# Patient Record
Sex: Female | Born: 1985 | Race: White | Hispanic: No | Marital: Single
Health system: Southern US, Community
[De-identification: ages and names within clinical notes are randomized; demographics above are authoritative.]

---
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
## 2004-04-17 ENCOUNTER — Emergency Department (HOSPITAL_COMMUNITY): Admission: EM | Admit: 2004-04-17 | Discharge: 2004-04-17 | Payer: Self-pay | Admitting: Emergency Medicine

## 2004-04-18 ENCOUNTER — Inpatient Hospital Stay (HOSPITAL_COMMUNITY): Admission: EM | Admit: 2004-04-18 | Discharge: 2004-04-22 | Payer: Self-pay | Admitting: Urology

## 2004-10-12 ENCOUNTER — Other Ambulatory Visit: Admission: RE | Admit: 2004-10-12 | Discharge: 2004-10-12 | Payer: Self-pay | Admitting: Obstetrics and Gynecology

## 2004-10-15 ENCOUNTER — Other Ambulatory Visit: Admission: RE | Admit: 2004-10-15 | Discharge: 2004-10-15 | Payer: Self-pay | Admitting: Obstetrics and Gynecology

## 2005-03-23 ENCOUNTER — Inpatient Hospital Stay (HOSPITAL_COMMUNITY): Admission: AD | Admit: 2005-03-23 | Discharge: 2005-03-25 | Payer: Self-pay | Admitting: Obstetrics & Gynecology

## 2005-09-03 ENCOUNTER — Emergency Department (HOSPITAL_COMMUNITY): Admission: EM | Admit: 2005-09-03 | Discharge: 2005-09-04 | Payer: Self-pay | Admitting: Emergency Medicine

## 2009-02-22 ENCOUNTER — Inpatient Hospital Stay (HOSPITAL_COMMUNITY): Admission: AD | Admit: 2009-02-22 | Discharge: 2009-02-25 | Payer: Self-pay | Admitting: Obstetrics & Gynecology

## 2010-03-20 ENCOUNTER — Inpatient Hospital Stay (HOSPITAL_COMMUNITY)
Admission: AD | Admit: 2010-03-20 | Discharge: 2010-03-20 | Payer: Self-pay | Source: Home / Self Care | Admitting: Obstetrics & Gynecology

## 2010-03-30 ENCOUNTER — Ambulatory Visit (HOSPITAL_COMMUNITY)
Admission: RE | Admit: 2010-03-30 | Discharge: 2010-03-30 | Payer: Self-pay | Source: Home / Self Care | Admitting: Obstetrics and Gynecology

## 2010-03-30 IMAGING — US US OB COMP +14 WK
1 series · 12 of 28 positions shown · non-contrast
Comparison: none

[Series 1: us ob comp +14 wk · 0.21mm/px · 12 of 94 slices shown]
[im 4/94]
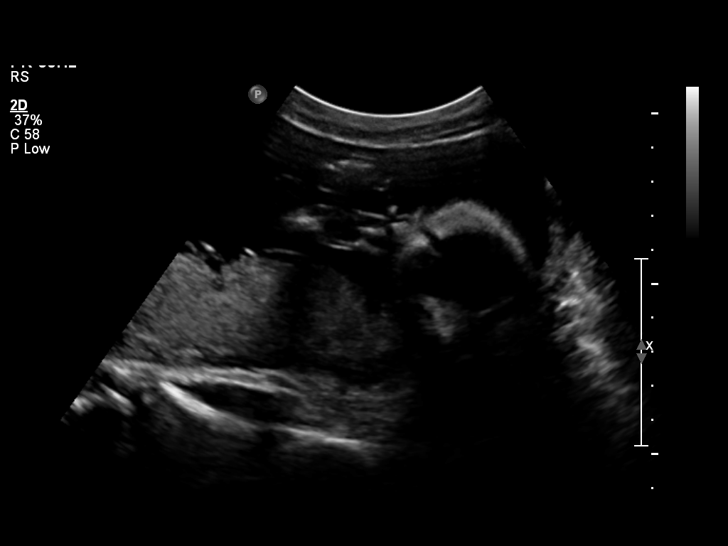
[im 11/94]
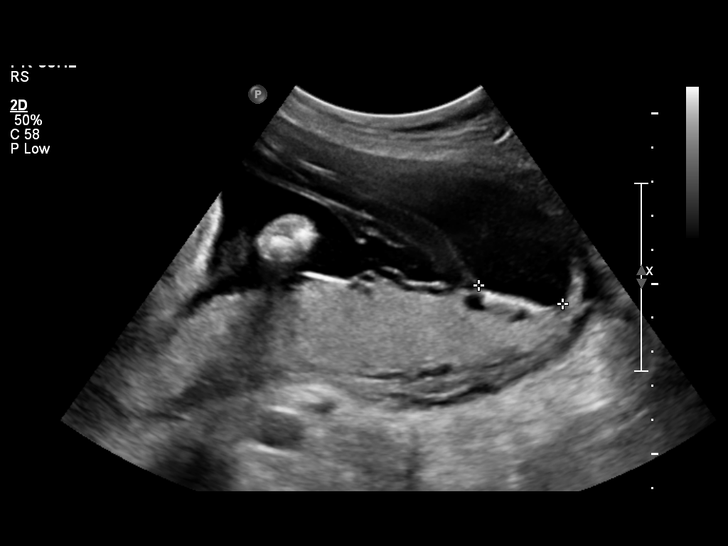
[im 18/94]
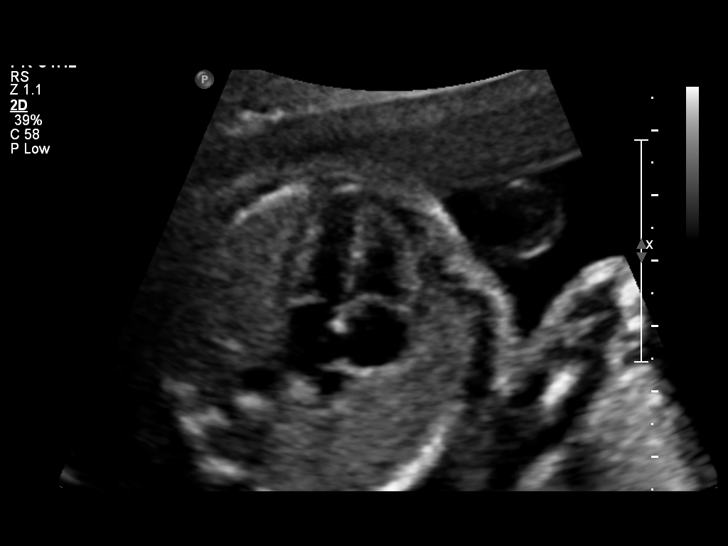
[im 28/94]
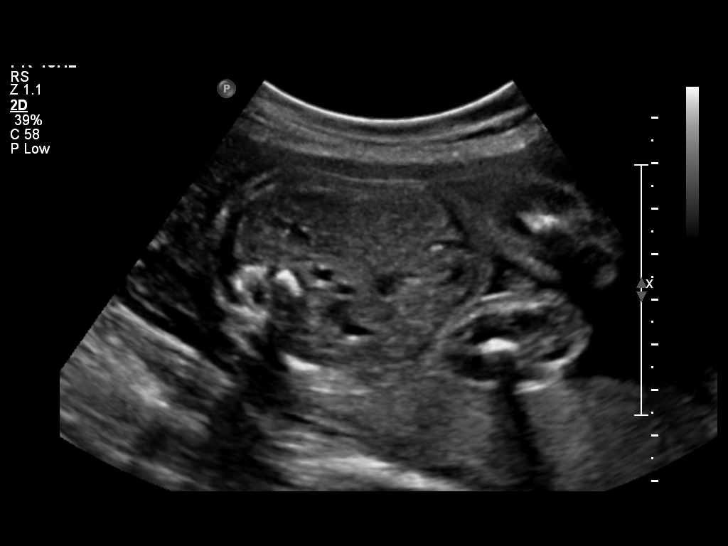
[im 35/94]
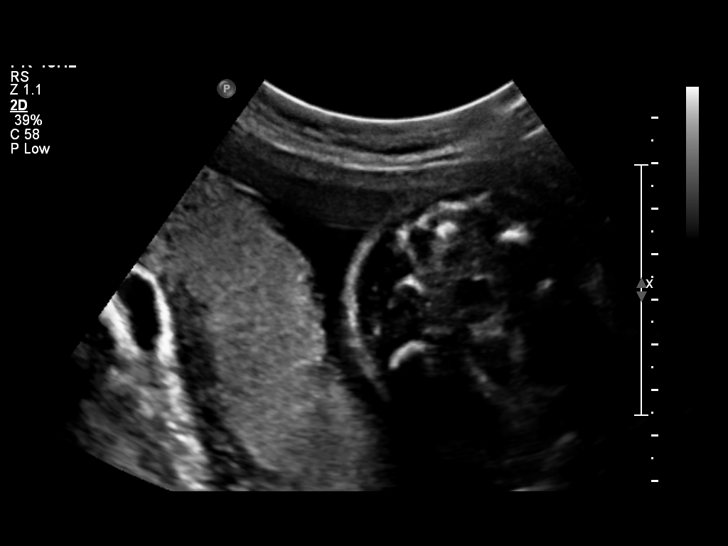
[im 42/94]
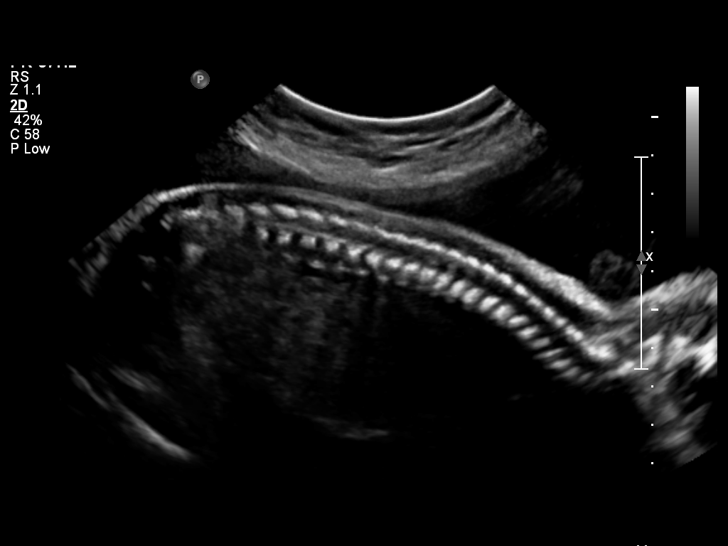
[im 52/94]
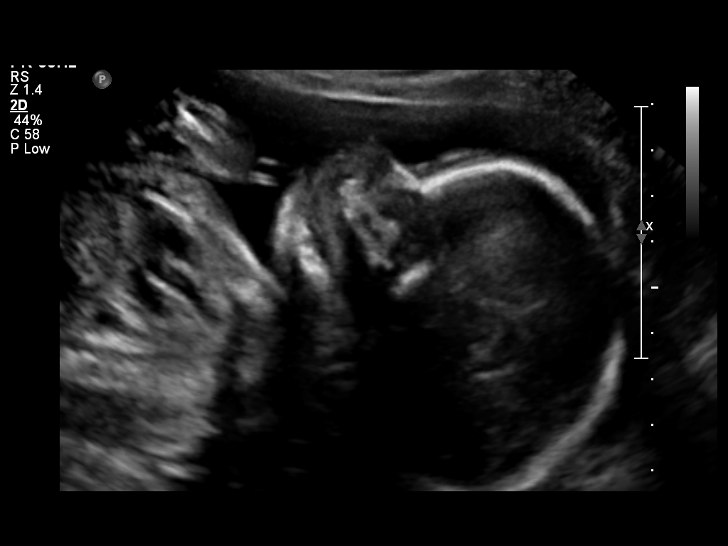
[im 59/94]
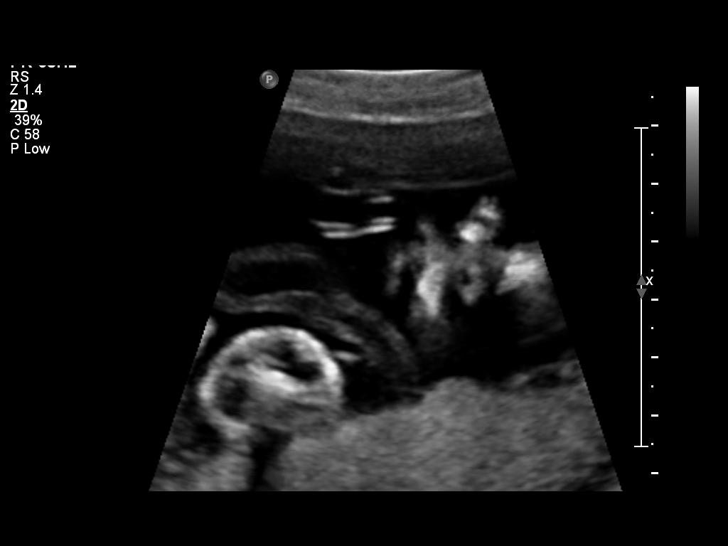
[im 66/94]
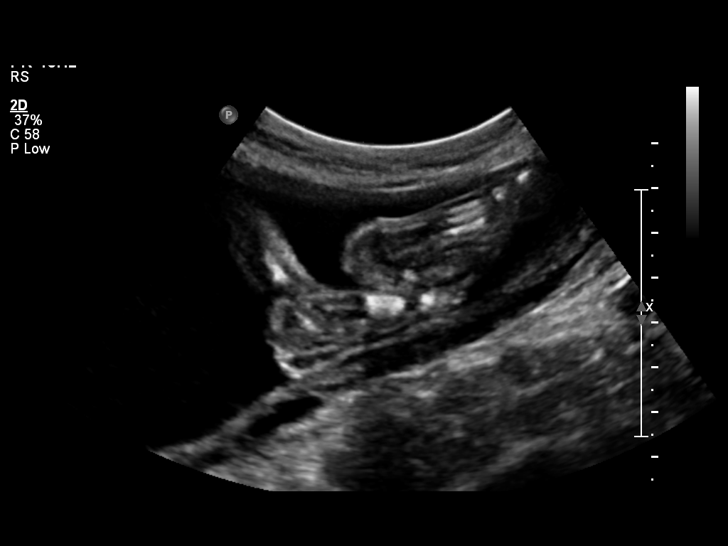
[im 76/94]
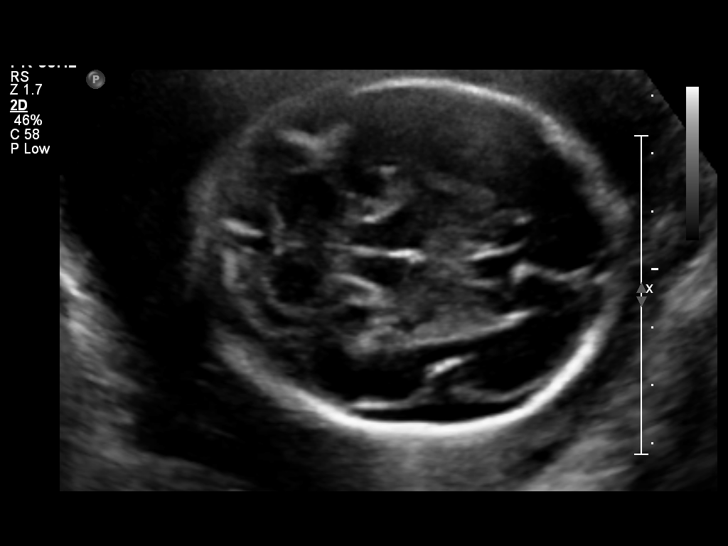
[im 83/94]
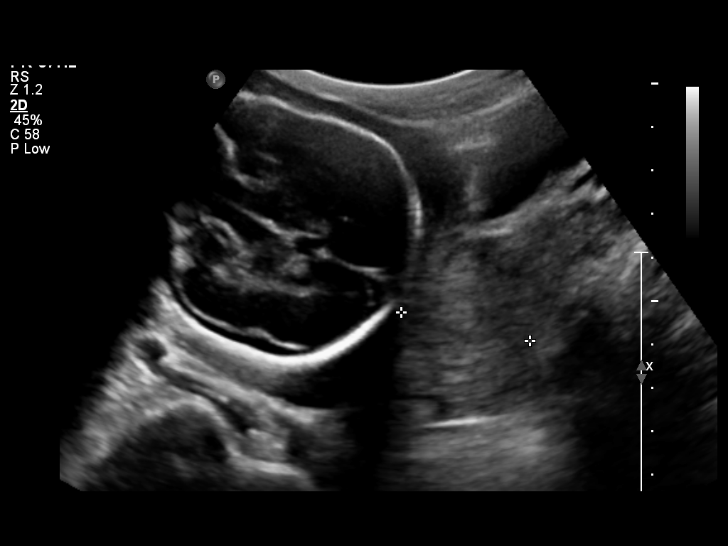
[im 90/94]
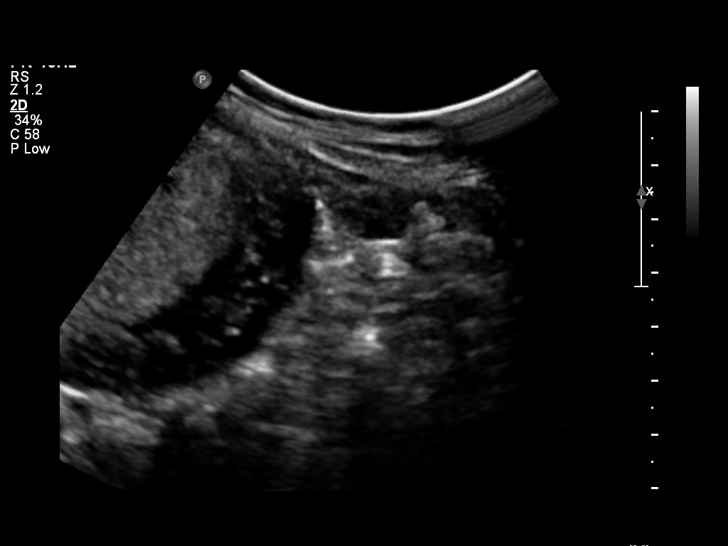

[12 of 28 positions shown; findings below may reference images not displayed]

OBSTETRICS REPORT
                      (Signed Final [DATE] [DATE])

                 Hospital Clinic-
                 Faculty Physician
 Order#:         _O
Procedures

 US OB COMP +14 WK                                     76805.1
Indications

 Basic anatomic survey
 No or Little Prenatal Care                            [HI]
Fetal Evaluation

 Fetal Heart Rate:  158                          bpm
 Cardiac Activity:  Observed
 Presentation:      Cephalic
 Placenta:          Posterior, above cervical
                    os
 P. Cord            Appears WNL
 Insertion:

 Amniotic Fluid
 AFI FV:      Subjectively within normal limits
                                             Larg Pckt:     4.3  cm
Biometry

 BPD:     59.5  mm     G. Age:  24w 2d                CI:        73.45   70 - 86
                                                      FL/HC:      19.0   18.7 -

 HC:     220.6  mm     G. Age:  24w 0d       30  %    HC/AC:      1.16   1.05 -

 AC:     189.9  mm     G. Age:  23w 5d       29  %    FL/BPD:     70.4   71 - 87
 FL:      41.9  mm     G. Age:  23w 5d       23  %    FL/AC:      22.1   20 - 24

 Est. FW:     626  gm      1 lb 6 oz     44  %
Gestational Age

 LMP:           24w 1d        Date:  [DATE]                 EDD:   [DATE]
 U/S Today:     23w 6d                                        EDD:   [DATE]
 Best:          24w 1d     Det. By:  LMP  ([DATE])          EDD:   [DATE]
Anatomy

 Cranium:           Appears normal      Aortic Arch:       Basic anatomy
                                                           exam per order
 Fetal Cavum:       Appears normal      Ductal Arch:       Appears normal
 Ventricles:        Appears normal      Diaphragm:         Appears normal
 Choroid Plexus:    Appears normal      Stomach:           Appears
                                                           normal, left
                                                           sided
 Cerebellum:        Appears normal      Abdomen:           Appears normal
 Posterior Fossa:   Appears normal      Abdominal Wall:    Appears nml
                                                           (cord insert,
                                                           abd wall)
 Nuchal Fold:       Not applicable      Cord Vessels:      Appears normal
                    (>20 wks GA)                           (3 vessel cord)
 Face:              Appears normal      Kidneys:           Appear normal
                    (lips/profile/orbit
                    s)
 Heart:             Appears normal      Bladder:           Appears normal
                    (4 chamber &
                    axis)
 RVOT:              Appears normal      Spine:             Appears normal
 LVOT:              Appears normal      Limbs:             Four extremities
                                                           visualized
                                                           (basic anatomy
                                                           exam)

 Other:     Parents do not wish to know sex of fetus. Nasal bone
            visualized. Heels visualized.
Cervix Uterus Adnexa

 Cervical Length:    3        cm

 Cervix:       Normal appearance by transabdominal scan.
 Left Ovary:    Within normal limits.
 Right Ovary:   Within normal limits.

 Adnexa:     No abnormality visualized.
Impression

 Single intrauterine gestation demonstrating an estimated
 gestational age by ultrasound of 23w 6d. This correlates well
 with expected estimated gestational age by LMP of 24w 1d.
 EFW is currently at the 44%.

 No focal anatomic or placental abnormalities are seen with a
 good exam possible.

 Subjectively and quantitatively normal amniotic fluid volume.

 Normal cervical length and appearance.

## 2010-04-04 ENCOUNTER — Ambulatory Visit: Payer: Self-pay | Admitting: Obstetrics and Gynecology

## 2010-04-05 ENCOUNTER — Encounter: Payer: Self-pay | Admitting: Obstetrics and Gynecology

## 2010-04-05 LAB — CONVERTED CEMR LAB
Chlamydia, DNA Probe: NEGATIVE
GC Probe Amp, Genital: NEGATIVE
Trich, Wet Prep: NONE SEEN

## 2010-05-02 ENCOUNTER — Encounter: Payer: Self-pay | Admitting: Obstetrics and Gynecology

## 2010-05-02 ENCOUNTER — Ambulatory Visit: Payer: Self-pay | Admitting: Obstetrics and Gynecology

## 2010-05-02 LAB — CONVERTED CEMR LAB
HCT: 33.5 % — ABNORMAL LOW (ref 36.0–46.0)
Hemoglobin: 11.6 g/dL — ABNORMAL LOW (ref 12.0–15.0)
MCHC: 34.6 g/dL (ref 30.0–36.0)
MCV: 88.6 fL (ref 78.0–100.0)
Platelets: 221 10*3/uL (ref 150–400)
RBC: 3.78 M/uL — ABNORMAL LOW (ref 3.87–5.11)
RDW: 12.9 % (ref 11.5–15.5)
WBC: 10.1 10*3/uL (ref 4.0–10.5)

## 2010-05-08 ENCOUNTER — Ambulatory Visit: Admit: 2010-05-08 | Payer: Self-pay | Admitting: Obstetrics & Gynecology

## 2010-05-09 ENCOUNTER — Encounter (INDEPENDENT_AMBULATORY_CARE_PROVIDER_SITE_OTHER): Payer: Self-pay | Admitting: *Deleted

## 2010-05-09 ENCOUNTER — Encounter: Payer: Self-pay | Admitting: Obstetrics and Gynecology

## 2010-05-09 ENCOUNTER — Ambulatory Visit
Admission: RE | Admit: 2010-05-09 | Discharge: 2010-05-09 | Payer: Self-pay | Source: Home / Self Care | Attending: Obstetrics and Gynecology | Admitting: Obstetrics and Gynecology

## 2010-05-09 LAB — CONVERTED CEMR LAB
Clue Cells Wet Prep HPF POC: NONE SEEN
Trich, Wet Prep: NONE SEEN
Yeast Wet Prep HPF POC: NONE SEEN

## 2010-05-16 ENCOUNTER — Ambulatory Visit
Admission: RE | Admit: 2010-05-16 | Discharge: 2010-05-16 | Payer: Self-pay | Source: Home / Self Care | Attending: Obstetrics and Gynecology | Admitting: Obstetrics and Gynecology

## 2010-05-21 LAB — POCT URINALYSIS DIPSTICK
Bilirubin Urine: NEGATIVE
Hgb urine dipstick: NEGATIVE
Ketones, ur: NEGATIVE mg/dL
Nitrite: NEGATIVE
Protein, ur: NEGATIVE mg/dL
Specific Gravity, Urine: 1.015 (ref 1.005–1.030)
Urine Glucose, Fasting: NEGATIVE mg/dL
Urobilinogen, UA: 0.2 mg/dL (ref 0.0–1.0)
pH: 7 (ref 5.0–8.0)

## 2010-05-30 ENCOUNTER — Ambulatory Visit
Admission: RE | Admit: 2010-05-30 | Discharge: 2010-05-30 | Payer: Self-pay | Source: Home / Self Care | Attending: Obstetrics and Gynecology | Admitting: Obstetrics and Gynecology

## 2010-05-30 LAB — POCT URINALYSIS DIPSTICK
Bilirubin Urine: NEGATIVE
Hgb urine dipstick: NEGATIVE
Ketones, ur: NEGATIVE mg/dL
Nitrite: NEGATIVE
Protein, ur: NEGATIVE mg/dL
Specific Gravity, Urine: 1.015 (ref 1.005–1.030)
Urine Glucose, Fasting: NEGATIVE mg/dL
Urobilinogen, UA: 0.2 mg/dL (ref 0.0–1.0)
pH: 7 (ref 5.0–8.0)

## 2010-06-13 ENCOUNTER — Other Ambulatory Visit: Payer: Self-pay

## 2010-06-13 DIAGNOSIS — O0930 Supervision of pregnancy with insufficient antenatal care, unspecified trimester: Secondary | ICD-10-CM

## 2010-06-13 LAB — POCT URINALYSIS DIPSTICK
Bilirubin Urine: NEGATIVE
Hgb urine dipstick: NEGATIVE
Ketones, ur: NEGATIVE mg/dL
Nitrite: NEGATIVE
Protein, ur: NEGATIVE mg/dL
Specific Gravity, Urine: 1.015 (ref 1.005–1.030)
Urine Glucose, Fasting: NEGATIVE mg/dL
Urobilinogen, UA: 0.2 mg/dL (ref 0.0–1.0)
pH: 7 (ref 5.0–8.0)

## 2010-06-14 ENCOUNTER — Encounter: Payer: Self-pay | Admitting: Obstetrics and Gynecology

## 2010-06-27 ENCOUNTER — Encounter: Payer: Self-pay | Admitting: Physician Assistant

## 2010-06-27 ENCOUNTER — Other Ambulatory Visit: Payer: Self-pay

## 2010-06-27 DIAGNOSIS — O0930 Supervision of pregnancy with insufficient antenatal care, unspecified trimester: Secondary | ICD-10-CM

## 2010-06-27 LAB — POCT URINALYSIS DIPSTICK
Bilirubin Urine: NEGATIVE
Hgb urine dipstick: NEGATIVE
Ketones, ur: NEGATIVE mg/dL
Nitrite: NEGATIVE
Protein, ur: NEGATIVE mg/dL
Specific Gravity, Urine: 1.01 (ref 1.005–1.030)
Urine Glucose, Fasting: NEGATIVE mg/dL
Urobilinogen, UA: 0.2 mg/dL (ref 0.0–1.0)
pH: 6 (ref 5.0–8.0)

## 2010-06-27 LAB — CONVERTED CEMR LAB
Chlamydia, DNA Probe: NEGATIVE
GC Probe Amp, Genital: NEGATIVE

## 2010-06-28 ENCOUNTER — Encounter: Payer: Self-pay | Admitting: Physician Assistant

## 2010-07-04 ENCOUNTER — Other Ambulatory Visit: Payer: Self-pay

## 2010-07-04 DIAGNOSIS — O0930 Supervision of pregnancy with insufficient antenatal care, unspecified trimester: Secondary | ICD-10-CM

## 2010-07-04 LAB — POCT URINALYSIS DIPSTICK
Bilirubin Urine: NEGATIVE
Hgb urine dipstick: NEGATIVE
Ketones, ur: NEGATIVE mg/dL
Nitrite: NEGATIVE
Protein, ur: NEGATIVE mg/dL
Specific Gravity, Urine: 1.02 (ref 1.005–1.030)
Urine Glucose, Fasting: NEGATIVE mg/dL
Urobilinogen, UA: 0.2 mg/dL (ref 0.0–1.0)
pH: 7 (ref 5.0–8.0)

## 2010-07-05 ENCOUNTER — Inpatient Hospital Stay (HOSPITAL_COMMUNITY)
Admission: AD | Admit: 2010-07-05 | Discharge: 2010-07-07 | DRG: 767 | Disposition: A | Discharge status: Home / Self Care | Payer: Medicaid Other | Source: Ambulatory Visit | Attending: Obstetrics & Gynecology | Admitting: Obstetrics & Gynecology

## 2010-07-05 DIAGNOSIS — Z302 Encounter for sterilization: Secondary | ICD-10-CM

## 2010-07-05 LAB — CBC
HCT: 35 % — ABNORMAL LOW (ref 36.0–46.0)
Hemoglobin: 11.9 g/dL — ABNORMAL LOW (ref 12.0–15.0)
MCH: 28.3 pg (ref 26.0–34.0)
MCHC: 34 g/dL (ref 30.0–36.0)
MCV: 83.1 fL (ref 78.0–100.0)
Platelets: 243 10*3/uL (ref 150–400)
RBC: 4.21 MIL/uL (ref 3.87–5.11)
RDW: 12.5 % (ref 11.5–15.5)
WBC: 14.2 10*3/uL — ABNORMAL HIGH (ref 4.0–10.5)

## 2010-07-06 DIAGNOSIS — Z302 Encounter for sterilization: Secondary | ICD-10-CM

## 2010-07-06 LAB — RPR: RPR Ser Ql: NONREACTIVE

## 2010-07-07 NOTE — Op Note (Addendum)
  NAME:  Bridget Braun, Bridget Braun NO.:  [id number]  MEDICAL RECORD NO.:  [id number]           PATIENT TYPE:  I  LOCATION:  9139                          FACILITY:  [facility]  PHYSICIAN:  Lesly Dukes, M.D. DATE OF BIRTH:  [birth year]  DATE OF PROCEDURE: DATE OF DISCHARGE:                              OPERATIVE REPORT   PREOPERATIVE DIAGNOSIS:  25 year old gravida 3, para 3, undesired fertility, on postpartum day #1, desires for tubal ligation.  POSTOPERATIVE DIAGNOSIS:  25 year old gravida 3, para 3, undesired fertility, on postpartum day #1, desires for tubal ligation.  PROCEDURE:  Postpartum bilateral tubal ligation with Filshie clips.  SURGEONS:  Lucina Mellow, DO; Lesly Dukes, MD.  ANESTHESIA:  Epidural.  FINDINGS:  Normal tubes.  SPECIMENS:  None.  ESTIMATED BLOOD LOSS:  Minimal, less than 20 mL.  IV FLUIDS:  400.  URINE OUTPUT:  225.  INDICATIONS:  This is a 25 year old gravida 3, para 3, who delivered yesterday evening at approximately 22:07 p.m., who had previously decided that she wanted her tubes tied prior to delivery.  A consent was signed and in the chart.  DESCRIPTION OF PROCEDURE:  The patient was taken to the operating room where her epidural was found to be adequate.  A small vertical umbilical incision was then made with a scalpel.  The incision was carried through the underlying fascia with a scalpel and Mayo scissors.  Peritoneum was identified and I entered.  The peritoneum was noted to be free of any adhesions and  the incision was extended with the Mayo scissors.  The patient's left fallopian tube was identified, brought to the incision with Babcock clamps, was followed out to the fimbria.  It was grasped with 2 Babcock clamps in the isthmic region, and a Filshie clip was placed between the tube with the perpendicular motion. Good hemostasis was noted and the tube was returned to the abdomen.  The right fallopian tube was then  identified in the same fashion, again with 2 Babcock clamps, and a Filshie clip was placed in a similar fashion. Hemostasis was noted and the tube was returned to the abdomen.  The peritoneum and fascia were closed with a single layer of 3-0 Vicryl.  The skin was closed with subcuticular fashion using 4-0 Vicryl.  The patient tolerated the procedure well.  Sponge, lap, needle counts were correct x2.  The patient was taken to the recovery room in stable condition.    ______________________________ Lucina Mellow, DO   ______________________________ Lesly Dukes, M.D.    SH/MEDQ  D:  07/06/2010  T:  07/06/2010  Job:  [id number]  Electronically Signed by Elsie Lincoln M.D. on 08/23/2010 10:19:35 AM

## 2010-07-13 NOTE — Discharge Summary (Signed)
  NAME:  Bridget Braun, MANCINO NO.:  [id number]  MEDICAL RECORD NO.:  [id number]           PATIENT TYPE:  I  LOCATION:  9139                          FACILITY:  [facility]  PHYSICIAN:  Lucina Mellow, DO   DATE OF BIRTH:  08-12-1985  DATE OF ADMISSION:  07/05/2010 DATE OF DISCHARGE:  07/07/2010                              DISCHARGE SUMMARY   The patient was admitted in labor.  HISTORY OF ILLNESS:  The patient is a 25 year old gravida 4, now para 3- 0-1-3, who presented at 48 weeks estimated gestational age in active labor.  She delivered a viable infant female weighing 6 pounds and 15 ounces.  The patient had a postpartum tubal ligation.  She has otherwise had a benign postpartum course.  She delivered spontaneously by vaginal delivery and had second degree laceration, which was repaired.  She is breast-feeding.  She will follow up in [facility] at 6 weeks' postpartum.  She was discharged home on Motrin, Percocet, Colace, iron, and prenatal vitamins.  All of her questions were answered and she is discharged home in stable condition.          ______________________________ Lucina Mellow, DO     SH/MEDQ  D:  07/07/2010  T:  07/07/2010  Job:  [id number]  Electronically Signed by Lucina Mellow MD on 07/12/2010 06:08:06 PM

## 2010-07-16 LAB — POCT URINALYSIS DIPSTICK
Bilirubin Urine: NEGATIVE
Glucose, UA: NEGATIVE mg/dL
Hgb urine dipstick: NEGATIVE
Ketones, ur: NEGATIVE mg/dL
Nitrite: NEGATIVE
Protein, ur: NEGATIVE mg/dL
Specific Gravity, Urine: 1.02 (ref 1.005–1.030)
Urobilinogen, UA: 0.2 mg/dL (ref 0.0–1.0)
pH: 7 (ref 5.0–8.0)

## 2010-07-17 LAB — RPR: RPR Ser Ql: NONREACTIVE

## 2010-07-17 LAB — CBC
HCT: 34.6 % — ABNORMAL LOW (ref 36.0–46.0)
Hemoglobin: 11.9 g/dL — ABNORMAL LOW (ref 12.0–15.0)
MCH: 32.2 pg (ref 26.0–34.0)
MCHC: 34.4 g/dL (ref 30.0–36.0)
MCV: 93.7 fL (ref 78.0–100.0)
Platelets: 243 10*3/uL (ref 150–400)
RBC: 3.7 MIL/uL — ABNORMAL LOW (ref 3.87–5.11)
RDW: 13.4 % (ref 11.5–15.5)
WBC: 8.3 10*3/uL (ref 4.0–10.5)

## 2010-07-17 LAB — URINALYSIS, ROUTINE W REFLEX MICROSCOPIC
Bilirubin Urine: NEGATIVE
Glucose, UA: NEGATIVE mg/dL
Hgb urine dipstick: NEGATIVE
Ketones, ur: NEGATIVE mg/dL
Nitrite: NEGATIVE
Protein, ur: NEGATIVE mg/dL
Specific Gravity, Urine: 1.02 (ref 1.005–1.030)
Urobilinogen, UA: 0.2 mg/dL (ref 0.0–1.0)
pH: 7 (ref 5.0–8.0)

## 2010-07-17 LAB — HEPATITIS B SURFACE ANTIGEN: Hepatitis B Surface Ag: NEGATIVE

## 2010-07-17 LAB — POCT URINALYSIS DIPSTICK
Bilirubin Urine: NEGATIVE
Nitrite: NEGATIVE
Protein, ur: NEGATIVE mg/dL
pH: 6.5 (ref 5.0–8.0)

## 2010-07-17 LAB — TYPE AND SCREEN: Antibody Screen: NEGATIVE

## 2010-07-17 LAB — URINE MICROSCOPIC-ADD ON

## 2010-07-17 LAB — DIFFERENTIAL
Basophils Absolute: 0 10*3/uL (ref 0.0–0.1)
Basophils Relative: 1 % (ref 0–1)
Eosinophils Absolute: 0 10*3/uL (ref 0.0–0.7)
Eosinophils Relative: 1 % (ref 0–5)
Lymphocytes Relative: 21 % (ref 12–46)
Lymphs Abs: 1.7 10*3/uL (ref 0.7–4.0)
Monocytes Absolute: 0.5 10*3/uL (ref 0.1–1.0)
Monocytes Relative: 5 % (ref 3–12)
Neutro Abs: 6.1 10*3/uL (ref 1.7–7.7)
Neutrophils Relative %: 73 % (ref 43–77)

## 2010-07-17 LAB — ABO/RH: ABO/RH(D): A POS

## 2010-07-17 LAB — HIV ANTIBODY (ROUTINE TESTING W REFLEX): HIV: NONREACTIVE

## 2010-08-09 LAB — URINE CULTURE: Special Requests: NEGATIVE

## 2010-08-09 LAB — BASIC METABOLIC PANEL
Calcium: 8.5 mg/dL (ref 8.4–10.5)
Chloride: 105 mEq/L (ref 96–112)
Creatinine, Ser: 0.6 mg/dL (ref 0.4–1.2)
GFR calc Af Amer: >60 mL/min (ref >60)
GFR calc non Af Amer: >60 mL/min (ref >60)

## 2010-08-09 LAB — CULTURE, BLOOD (ROUTINE X 2)
Culture: NO GROWTH
Culture: NO GROWTH

## 2010-08-09 LAB — COMPREHENSIVE METABOLIC PANEL
ALT: 15 U/L (ref 0–35)
AST: 26 U/L (ref 0–37)
Calcium: 8.3 mg/dL — ABNORMAL LOW (ref 8.4–10.5)
GFR calc Af Amer: >60 mL/min (ref >60)
Glucose, Bld: 144 mg/dL — ABNORMAL HIGH (ref 70–99)
Sodium: 135 mEq/L (ref 135–145)
Total Protein: 5.5 g/dL — ABNORMAL LOW (ref 6.0–8.3)

## 2010-08-09 LAB — CBC
Hemoglobin: 12.7 g/dL (ref 12.0–15.0)
MCHC: 34.4 g/dL (ref 30.0–36.0)
Platelets: 269 10*3/uL (ref 150–400)
RBC: 3.21 MIL/uL — ABNORMAL LOW (ref 3.87–5.11)
RBC: 3.43 MIL/uL — ABNORMAL LOW (ref 3.87–5.11)
RDW: 12.9 % (ref 11.5–15.5)
RDW: 12.9 % (ref 11.5–15.5)
WBC: 15.1 10*3/uL — ABNORMAL HIGH (ref 4.0–10.5)
WBC: 22.4 10*3/uL — ABNORMAL HIGH (ref 4.0–10.5)

## 2010-08-09 LAB — DIFFERENTIAL
Eosinophils Absolute: 0 10*3/uL (ref 0.0–0.7)
Lymphocytes Relative: 6 % — ABNORMAL LOW (ref 12–46)
Lymphs Abs: 1 10*3/uL (ref 0.7–4.0)
Lymphs Abs: 1.4 10*3/uL (ref 0.7–4.0)
Monocytes Relative: 5 % (ref 3–12)
Monocytes Relative: 6 % (ref 3–12)
Neutro Abs: 19.5 10*3/uL — ABNORMAL HIGH (ref 1.7–7.7)
Neutrophils Relative %: 87 % — ABNORMAL HIGH (ref 43–77)
Neutrophils Relative %: 89 % — ABNORMAL HIGH (ref 43–77)

## 2010-08-09 LAB — PROTIME-INR
INR: 1.01 (ref 0.00–1.49)
Prothrombin Time: 13.2 seconds (ref 11.6–15.2)

## 2010-08-09 LAB — URIC ACID: Uric Acid, Serum: 2.9 mg/dL (ref 2.4–7.0)

## 2010-08-09 LAB — APTT: aPTT: 30 seconds (ref 24–37)

## 2010-09-21 NOTE — H&P (Signed)
Bridget Braun, GENSEL NO.:  [id number]   MEDICAL RECORD NO.:  [id number]          PATIENT TYPE:  INP   LOCATION:  0377                         FACILITY:  [facility]   PHYSICIAN:  Jamison Neighbor, M.D.  DATE OF BIRTH:  1986/02/10   DATE OF ADMISSION:  04/18/2004  DATE OF DISCHARGE:                                HISTORY & PHYSICAL   ADMISSION DIAGNOSES:  1.  Probable pyelonephritis.  2.  Distal ureteral calculus.   HISTORY:  This 25 year old female has a past history of stones.  In the  past, the patient had an obstruction on the left hand side and required  stone extraction by Dr. Lowella Grip in [place], [place]. The patient  apparently had a high grade obstruction with associated pyelonephritis at  that time, she required antibiotic therapy.   The patient has had no further stones until just recently when she began to  develop pain on the right hand side. She was seen in the emergency room one  day prior to admission, she was told that she had a distal stone on the  right hand side that would likely pass. This is a very small stone and  normally I would anticipate that it would pass but when the patient  presented to my office on December 14,  she was quite toxic and it was felt  that she needed immediate attention.  The patient is to be admitted to the  hospital for IV antibiotic therapy.  We will make arrangements for her to  have her stone removed. The patient has had some __________ and certainly  cannot be done immediately but obviously it will be done on an emergent  basis if the patient develops signs of sepsis. She is being admitted for IV  therapy, IV fluid resuscitation and removal of the stone if it does not pass  overnight.   PAST MEDICAL HISTORY:  Remarkable for this episode as described above  otherwise she has no chronic medical illnesses. She drinks a modest amount  of alcohol, she does smoke a 1/2 pack of cigarettes a day and has done so  for four years.  The past medical history is pertinent for an episode of  pneumonia in 2004. The patient apparently had some complications of her last  hospitalization and was told that she had liver damage from the kidney  stones or perhaps from the medication that were given.  We can certainly  make sure that her liver panel is checked.  Aside from occasional headaches  she has an otherwise negative past medical history and review of systems.   PHYSICAL EXAMINATION:  GENERAL:  She is an ill appearing female without  acute distress.  VITAL SIGNS:  Temperature 100.5, pulse 119 which we feel is elevated  secondary to pain.  Respirations 20, blood pressure 98/55, current weight  103.  HEENT:  Normocephalic, atraumatic with the exception that her mucous  membranes are dry.  Her eyes are somewhat sunken and she is clearly somewhat  dehydrated. There is no adenopathy or thyromegaly, there is no signs of  bruits.  LUNGS:  Clear.  HEART:  Regular rate and rhythm without murmur, thrills, gallops, rubs or  heaves.  ABDOMEN:  The abdomen is soft but she does have right sided flank pain and  some right lower quadrant pain, no rebound or guarding is seen.  EXTREMITIES:  No cyanosis, clubbing or edema.  NEUROLOGIC:  Neurologic and vascular systems are intact as noted above.  Skin turgor is somewhat diminished consistent with dehydration.  PELVIC:  Deferred until the time of the planned cystoscopy.   IMPRESSION:  1.  Distal ureteral calculus.  2.  Urinary tract infection with possible pyelonephritis.   PLAN:  Admit the patient to the hospital for IV antibiotics in preparation  for stone removal.      RJE/MEDQ  D:  04/19/2004  T:  04/19/2004  Job:  [id number]   cc:   Reuben Likes, M.D.  [address]  [place]  [place] [address]  Fax: [phone]

## 2010-09-21 NOTE — Op Note (Signed)
Bridget Braun, Bridget Braun NO.:  [id number]   MEDICAL RECORD NO.:  [id number]          PATIENT TYPE:  INP   LOCATION:  0377                         FACILITY:  [facility]   PHYSICIAN:  Jamison Neighbor, M.D.  DATE OF BIRTH:  06/05/1985   DATE OF PROCEDURE:  04/19/2004  DATE OF DISCHARGE:                                 OPERATIVE REPORT   PREOPERATIVE DIAGNOSES:  1.  Right ureteropelvic junction calculus.  2.  Pyelonephritis.   POSTOPERATIVE DIAGNOSES:  1.  Right ureteropelvic junction calculus.  2.  Pyelonephritis.   PROCEDURE:  Cystoscopy, right retrograde, right ureteroscopy, right basket  extraction and right double J catheter insertion.   SURGEON:  Jamison Neighbor, M.D.   ANESTHESIA:  General.   COMPLICATIONS:  None.   DRAINS:  6 French x 26 cm double J catheter.   BRIEF HISTORY:  This 25 year old female has a past history of ureteral  calculi.  In addition, the patient had an episode where she had  pyelonephritis associated with her stone. The patient presented to the  emergency room the other night and was found to have a 3 mm stone in the  distal right ureter. She was sent home with the advise that this stone would  pass.   The patient presented to my office yesterday and was quite ill. She was felt  to be too sick to send home and we made arrangements for her to be admitted  to the hospital, started on IV antibiotics in preparation for stone  extraction. The patient is now to undergo removal of the stone.  She  understands the risks and benefits of the procedure.  Full informed consent  was obtained. This was discussed in detail with the patient's mother.   DESCRIPTION OF PROCEDURE:  After successful induction of general anesthesia,  the patient was placed in the dorsal lithotomy position, prepped with  Betadine and draped in the usual sterile fashion.  The external genitalia  were completely normal in their appearance, the urethra was unremarkable as  well. The cystoscope was inserted, the bladder was carefully inspected and  was free of tumor or stones.  Both ureteral orifices are normal in  configuration and location. A guidewire was passed up into the left kidney,  retrograde study confirmed that there was minimal hydronephrosis but  certainly not wide spread obstruction. The stone is quite small and could  not be well visualized on the initial retrograde study. A wire was passed  into the ureter and allowed to coil in the renal pelvis. The ureteroscope  was inserted and was advanced almost to the kidney. The stone had migrated  superiorly but could be seen in the more proximal ureter. It was carefully  visualized, grasped with a basket and extracted under direct vision. The  ureteroscope was reinserted and the entire ureter was inspected.  It had not  been traumatized in any way but no other stones could be seen either in the  ureter or even up in the kidney proper. The stone will be sent for stone  analysis. The decision was made to place a double J  stent because of the  fact that the patient has had an elevated white count and fever and it is  felt that we must guarantee adequate drainage of this kidney while she is  being treated with antibiotics.  The double J catheter was passed over the  wire, allowed to coil normally in the pelvis as well as within the bladder.  This was left to a string so it could be removed in the next couple of days.  The bladder was drained and the patient was taken to the recovery room in  good condition. She will be maintained in the hospital for the next 24-48  hours until she has completely defervesced and is pain free.      RJE/MEDQ  D:  04/19/2004  T:  04/19/2004  Job:  [id number]   cc:   Reuben Likes, M.D.  [address]  [place]  [place] [address]  Fax: [phone]

## 2010-09-21 NOTE — Discharge Summary (Signed)
Bridget, Braun NO.:  [id number]   MEDICAL RECORD NO.:  [id number]          PATIENT TYPE:  INP   LOCATION:  0380                         FACILITY:  [facility]   PHYSICIAN:  Jamison Neighbor, M.D.  DATE OF BIRTH:  06-Jul-1985   DATE OF ADMISSION:  04/18/2004  DATE OF DISCHARGE:  04/22/2004                                 DISCHARGE SUMMARY   SERVICE:  Urology.   DISCHARGE DIAGNOSES:  1.  Ureteral calculus.  2.  History of tobacco use.   PRINCIPAL PROCEDURE:  Ureteroscopy, retrograde pyelogram and removal of  distal ureteral calculus.   HISTORY:  This 25 year old female has a past history of stones. The patient  had obstruction on the left hand side requiring stone extraction by Dickey Gave in [place], [place].  This was apparently with a high grade  obstruction with significant pyelonephritis and a very stormy postoperative  course. The patient presented to the emergency room one day prior to  admission and was told she had a stone in the distal right that she thought  would pass. The patient was admitted to the hospital. The patient was  admitted for antibiotic therapy. The patient had some food to eat earlier in  that day so could not undergo immediate ureteroscopy but the plan was made  for her to undergo __________ initial history and physical   HOSPITAL COURSE:  The patient was admitted to the hospital for IV therapy  and IV antibiotics. She was taken to the OR on April 19, 2004 where she  underwent cystoscopy, retrograde ureteroscopy with basket extraction and  double J catheter insertion.  Because it appeared that the patient had  pyelonephritis, she was maintained on antibiotic therapy. The patient did  defervesce and felt much better following the removal of the stone and the  antibiotic therapy.  The patient was seen by Dr. Patsi Sears over the weekend  and was felt to be ready for discharge by April 22, 2004. The _________  was controlled,  her nausea was better with therapy.  She was sent home on  Phenergan, Percocet and Cipro with plans to return to see [place] in routine  followup including stent removal.      RJE/MEDQ  D:  05/23/2004  T:  05/23/2004  Job:  [id number]

## 2019-08-27 ENCOUNTER — Ambulatory Visit: Payer: Self-pay | Attending: Internal Medicine

## 2019-08-27 ENCOUNTER — Ambulatory Visit: Payer: Self-pay

## 2019-08-27 DIAGNOSIS — Z23 Encounter for immunization: Secondary | ICD-10-CM

## 2019-08-27 NOTE — Progress Notes (Signed)
   Covid-19 Vaccination Clinic  Name:  ABRIL CAPPIELLO    MRN: [id number] DOB: Jul 01, 1985  08/27/2019  Ms. Grade was observed post Covid-19 immunization for 15 minutes without incident. She was provided with Vaccine Information Sheet and instruction to access the V-Safe system.   Ms. Casler was instructed to call 911 with any severe reactions post vaccine: Marland Kitchen Difficulty breathing  . Swelling of face and throat  . A fast heartbeat  . A bad rash all over body  . Dizziness and weakness   Immunizations Administered    Name Date Dose VIS Date Route   Pfizer COVID-19 Vaccine 08/27/2019 11:17 AM 0.3 mL 06/30/2018 Intramuscular   Manufacturer: [employer], [employer]   Lot: [id number]   NDC: [address]-8

## 2019-09-20 ENCOUNTER — Ambulatory Visit: Payer: Self-pay

## 2019-09-23 ENCOUNTER — Ambulatory Visit: Payer: Self-pay | Attending: Internal Medicine

## 2019-09-23 DIAGNOSIS — Z23 Encounter for immunization: Secondary | ICD-10-CM

## 2019-09-23 NOTE — Progress Notes (Signed)
   Covid-19 Vaccination Clinic  Name:  LUCKY ALVERSON    MRN: [id number] DOB: Mar 05, 1986  09/23/2019  Ms. Patron was observed post Covid-19 immunization for 15 minutes without incident. She was provided with Vaccine Information Sheet and instruction to access the V-Safe system.   Ms. Alberico was instructed to call 911 with any severe reactions post vaccine: Marland Kitchen Difficulty breathing  . Swelling of face and throat  . A fast heartbeat  . A bad rash all over body  . Dizziness and weakness   Immunizations Administered    Name Date Dose VIS Date Route   Pfizer COVID-19 Vaccine 09/23/2019  9:28 AM 0.3 mL 06/30/2018 Intramuscular   Manufacturer: [employer], [employer]   Lot: [id number]   NDC: [address]-0
